# Patient Record
Sex: Male | Born: 1995 | Race: Black or African American | Hispanic: No | Marital: Single | Smoking: Current every day smoker
Health system: Southern US, Community
[De-identification: ages and names within clinical notes are randomized; demographics above are authoritative.]

## PROBLEM LIST (undated history)

## (undated) DIAGNOSIS — F988 Other specified behavioral and emotional disorders with onset usually occurring in childhood and adolescence: Secondary | ICD-10-CM

## (undated) HISTORY — PX: TONSILLECTOMY: SUR1361

## (undated) HISTORY — PX: TONSILLECTOMY AND ADENOIDECTOMY: SUR1326

---
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
## 2018-05-10 ENCOUNTER — Encounter (HOSPITAL_COMMUNITY): Payer: Self-pay

## 2018-05-10 ENCOUNTER — Emergency Department (HOSPITAL_COMMUNITY)
Admission: EM | Admit: 2018-05-10 | Discharge: 2018-05-10 | Disposition: A | Discharge status: Home / Self Care | Payer: Self-pay | Attending: Emergency Medicine | Admitting: Emergency Medicine

## 2018-05-10 ENCOUNTER — Other Ambulatory Visit: Payer: Self-pay

## 2018-05-10 DIAGNOSIS — Z79899 Other long term (current) drug therapy: Secondary | ICD-10-CM | POA: Insufficient documentation

## 2018-05-10 DIAGNOSIS — A549 Gonococcal infection, unspecified: Secondary | ICD-10-CM | POA: Insufficient documentation

## 2018-05-10 DIAGNOSIS — Z202 Contact with and (suspected) exposure to infections with a predominantly sexual mode of transmission: Secondary | ICD-10-CM | POA: Insufficient documentation

## 2018-05-10 DIAGNOSIS — R369 Urethral discharge, unspecified: Secondary | ICD-10-CM

## 2018-05-10 DIAGNOSIS — A749 Chlamydial infection, unspecified: Secondary | ICD-10-CM | POA: Insufficient documentation

## 2018-05-10 LAB — URINALYSIS, ROUTINE W REFLEX MICROSCOPIC
Bilirubin Urine: NEGATIVE
Glucose, UA: NEGATIVE mg/dL
Hgb urine dipstick: NEGATIVE
Ketones, ur: NEGATIVE mg/dL
Nitrite: NEGATIVE
Protein, ur: NEGATIVE mg/dL
Specific Gravity, Urine: 1.021 (ref 1.005–1.030)
WBC, UA: >50 WBC/hpf — ABNORMAL HIGH (ref 0–5)
pH: 7 (ref 5.0–8.0)

## 2018-05-10 MED ORDER — AZITHROMYCIN 250 MG PO TABS
1000.0000 mg | ORAL_TABLET | Freq: Once | ORAL | Status: AC
Start: 2018-05-10 — End: 2018-05-10
  Administered 2018-05-10: 1000 mg via ORAL
  Filled 2018-05-10: qty 4

## 2018-05-10 MED ORDER — CEFTRIAXONE SODIUM 250 MG IJ SOLR
250.0000 mg | Freq: Once | INTRAMUSCULAR | Status: AC
  Administered 2018-05-10: 250 mg via INTRAMUSCULAR
  Filled 2018-05-10: qty 250

## 2018-05-10 MED ORDER — LIDOCAINE HCL (PF) 1 % IJ SOLN
INTRAMUSCULAR | Status: AC
  Administered 2018-05-10: 0.9 mL
  Filled 2018-05-10: qty 5

## 2018-05-10 NOTE — ED Triage Notes (Signed)
Pt here for evaluation of green discharge from the penis.  Had sex with new partner Monday and she has Gonorrhea.  No other complaints.

## 2018-05-10 NOTE — ED Provider Notes (Signed)
[facility] EMERGENCY DEPARTMENT Provider Note   CSN: [id number] Arrival date & time: 05/10/18  1814     History   Chief Complaint Chief Complaint  Patient presents with  . Exposure to STD    HPI Blake Clark is a 22 y.o. male.  HPI   22 year old male presents today with complaints of penile discharge.  Patient notes she had sex with a new partner approximately 4 days ago.  He notes discharge from his penis today.  He notes some burning with urination.  Patient reports he has sex with only male partners.  Denies any other complaints including abdominal pain fever nausea or vomiting.  Past Medical History:  Diagnosis Date  . Attention deficit disorder (ADD)     There are no active problems to display for this patient.   Past Surgical History:  Procedure Laterality Date  . TONSILLECTOMY    . TONSILLECTOMY AND ADENOIDECTOMY          Home Medications    Prior to Admission medications   Medication Sig Start Date End Date Taking? Authorizing Provider  cephALEXin (KEFLEX) 500 MG capsule Take 1 capsule (500 mg total) by mouth 4 (four) times daily. 06/17/15   Hess, Nada Boozer, PA-C  HYDROcodone-acetaminophen (NORCO/VICODIN) 5-325 MG tablet Take 1 tablet by mouth every 6 (six) hours as needed for severe pain. 09/04/15   Ward, Chase Picket, PA-C  ibuprofen (ADVIL,MOTRIN) 800 MG tablet Take 1 tablet (800 mg total) by mouth 3 (three) times daily. 09/04/15   Ward, Chase Picket, PA-C    Family History History reviewed. No pertinent family history.  Social History Social History   Tobacco Use  . Smoking status: Never Smoker  . Smokeless tobacco: Never Used  Substance Use Topics  . Alcohol use: No  . Drug use: No     Allergies   Patient has no known allergies.   Review of Systems Review of Systems  All other systems reviewed and are negative.    Physical Exam Updated Vital Signs BP (!) 145/100   Pulse 62   Temp 98 F (36.7 C)   Resp 19    SpO2 100%   Physical Exam  Constitutional: He is oriented to person, place, and time. He appears well-developed and well-nourished.  HENT:  Head: Normocephalic and atraumatic.  Eyes: Pupils are equal, round, and reactive to light. Conjunctivae are normal. Right eye exhibits no discharge. Left eye exhibits no discharge. No scleral icterus.  Neck: Normal range of motion. No JVD present. No tracheal deviation present.  Pulmonary/Chest: Effort normal. No stridor.  Abdominal: Soft. He exhibits no distension. There is no tenderness.  Genitourinary:  Genitourinary Comments: Uncircumcised penis with purulent discharge-no rashes  Neurological: He is alert and oriented to person, place, and time. Coordination normal.  Psychiatric: He has a normal mood and affect. His behavior is normal. Judgment and thought content normal.  Nursing note and vitals reviewed.    ED Treatments / Results  Labs (all labs ordered are listed, but only abnormal results are displayed) Labs Reviewed  URINALYSIS, ROUTINE W REFLEX MICROSCOPIC - Abnormal; Notable for the following components:      Result Value   APPearance CLOUDY (*)    Leukocytes, UA SMALL (*)    WBC, UA >50 (*)    Bacteria, UA FEW (*)    All other components within normal limits  GC/CHLAMYDIA PROBE AMP ([place]) NOT AT [facility]    EKG None  Radiology No results found.  Procedures  Procedures (including critical care time)  Medications Ordered in ED Medications  cefTRIAXone (ROCEPHIN) injection 250 mg (250 mg Intramuscular Given 05/10/18 1938)  azithromycin (ZITHROMAX) tablet 1,000 mg (1,000 mg Oral Given 05/10/18 1937)  lidocaine (PF) (XYLOCAINE) 1 % injection (0.9 mLs  Given 05/10/18 1938)     Initial Impression / Assessment and Plan / ED Course  I have reviewed the triage vital signs and the nursing notes.  Pertinent labs & imaging results that were available during my care of the patient were reviewed by me and considered in my  medical decision making (see chart for details).     Patient presents with likely STD.  Treated prophylactically return precautions given.  He verbalized understanding and agreement to today's plan.  Final Clinical Impressions(s) / ED Diagnoses   Final diagnoses:  Penile discharge    ED Discharge Orders    None       Rosalio Loud 05/10/18 2132    Raeford Razor, MD 05/11/18 1136

## 2018-05-10 NOTE — Discharge Instructions (Addendum)
Please read attached information. If you experience any new or worsening signs or symptoms please return to the emergency room for evaluation. Please follow-up with your primary care provider or specialist as discussed.  °

## 2018-05-11 LAB — GC/CHLAMYDIA PROBE AMP (~~LOC~~) NOT AT ARMC
Chlamydia: POSITIVE — AB
Chlamydia: POSITIVE — AB
Neisseria Gonorrhea: POSITIVE — AB
Neisseria Gonorrhea: POSITIVE — AB

## 2018-06-16 ENCOUNTER — Encounter (HOSPITAL_COMMUNITY): Payer: Self-pay

## 2018-06-16 ENCOUNTER — Other Ambulatory Visit: Payer: Self-pay

## 2018-06-16 ENCOUNTER — Emergency Department (HOSPITAL_COMMUNITY)
Admission: EM | Admit: 2018-06-16 | Discharge: 2018-06-16 | Disposition: A | Discharge status: Home / Self Care | Payer: Self-pay | Attending: Emergency Medicine | Admitting: Emergency Medicine

## 2018-06-16 DIAGNOSIS — F1290 Cannabis use, unspecified, uncomplicated: Secondary | ICD-10-CM | POA: Insufficient documentation

## 2018-06-16 DIAGNOSIS — Z202 Contact with and (suspected) exposure to infections with a predominantly sexual mode of transmission: Secondary | ICD-10-CM | POA: Insufficient documentation

## 2018-06-16 DIAGNOSIS — R369 Urethral discharge, unspecified: Secondary | ICD-10-CM | POA: Insufficient documentation

## 2018-06-16 DIAGNOSIS — Z711 Person with feared health complaint in whom no diagnosis is made: Secondary | ICD-10-CM

## 2018-06-16 MED ORDER — AZITHROMYCIN 1 G PO PACK
1.0000 g | PACK | Freq: Once | ORAL | Status: AC
  Administered 2018-06-16: 1 g via ORAL
  Filled 2018-06-16: qty 1

## 2018-06-16 MED ORDER — CEFTRIAXONE SODIUM 250 MG IJ SOLR
250.0000 mg | Freq: Once | INTRAMUSCULAR | Status: AC
  Administered 2018-06-16: 250 mg via INTRAMUSCULAR
  Filled 2018-06-16: qty 250

## 2018-06-16 NOTE — ED Provider Notes (Addendum)
Emergency Department Provider Note   I have reviewed the triage vital signs and the nursing notes.   HISTORY  Chief Complaint Penile Discharge   HPI Blake Clark is a [age] y.o. male who was recently treated for gc/chlamydia but had sex with partner that he had when he was infected and now is back with symptoms again. She tested positive for chlamydia as well. Has dysuria, discharge. No back pain or rectal pain.   No other associated or modifying symptoms.    Past Medical History:  Diagnosis Date  . Attention deficit disorder (ADD)     There are no active problems to display for this patient.   Past Surgical History:  Procedure Laterality Date  . TONSILLECTOMY    . TONSILLECTOMY AND ADENOIDECTOMY        Allergies Patient has no known allergies.  History reviewed. No pertinent family history.  Social History Social History   Tobacco Use  . Smoking status: Never Smoker  . Smokeless tobacco: Never Used  Substance Use Topics  . Alcohol use: No  . Drug use: Yes    Types: Marijuana    Comment: DAILY    Review of Systems  All other systems negative except as documented in the HPI. All pertinent positives and negatives as reviewed in the HPI. ____________________________________________   PHYSICAL EXAM:  VITAL SIGNS: ED Triage Vitals  Enc Vitals Group     BP 06/16/18 0213 133/79     Pulse Rate 06/16/18 0213 74     Resp 06/16/18 0213 18     Temp 06/16/18 0220 (!) 97.3 F (36.3 C)     Temp Source 06/16/18 0220 Oral     SpO2 06/16/18 0213 98 %     Weight 06/16/18 0213 225 lb (102.1 kg)     Height 06/16/18 0213 6\' 2"  (1.88 m)    Constitutional: Alert and oriented. Well appearing and in no acute distress. Eyes: Conjunctivae are normal. PERRL. EOMI. Head: Atraumatic. Nose: No congestion/rhinnorhea. Mouth/Throat: Mucous membranes are moist.  Oropharynx non-erythematous. Neck: No stridor.  No meningeal signs.   Cardiovascular: Normal rate, regular  rhythm. Good peripheral circulation. Grossly normal heart sounds.   Respiratory: Normal respiratory effort.  No retractions. Lungs CTAB. Gastrointestinal: Soft and nontender. No distention.  GU: no rash, urethritis. Did have discharge.  Musculoskeletal: No lower extremity tenderness nor edema. No gross deformities of extremities. Neurologic:  Normal speech and language. No gross focal neurologic deficits are appreciated.  Skin:  Skin is warm, dry and intact. No rash noted.  ____________________________________________   LABS (all labs ordered are listed, but only abnormal results are displayed)  Labs Reviewed  GC/CHLAMYDIA PROBE AMP ([place]) NOT [facility]   ____________________________________________   INITIAL IMPRESSION / ASSESSMENT AND PLAN / ED COURSE  tx for gc/chlam. Recheck. Advised no intercourse for 2 weeks until both were cleared by respective PCP or health departments.      Pertinent labs & imaging results that were available during my care of the patient were reviewed by me and considered in my medical decision making (see chart for details).  ____________________________________________  FINAL CLINICAL IMPRESSION(S) / ED DIAGNOSES  Final diagnoses:  Penile discharge  Concern about STD in male without diagnosis     MEDICATIONS GIVEN DURING THIS VISIT:  Medications  cefTRIAXone (ROCEPHIN) injection 250 mg (250 mg Intramuscular Given 06/16/18 0312)  azithromycin (ZITHROMAX) powder 1 g (1 g Oral Given 06/16/18 0311)     NEW OUTPATIENT MEDICATIONS STARTED DURING THIS  VISIT:  There are no discharge medications for this patient.   Note:  This note was prepared with assistance of Dragon voice recognition software. Occasional wrong-word or sound-a-like substitutions may have occurred due to the inherent limitations of voice recognition software.   Marily Memos, MD 06/16/18 1610    Marily Memos, MD 06/29/18 [phone]

## 2018-06-16 NOTE — ED Triage Notes (Addendum)
PT C/O PENILE DISCHARGE AND IRRITATION SINCE YESTERDAY. DENIES FEVER. PT STS HIS PARTNER WAS POSITIVE FOR GONORRHEA.

## 2018-06-18 LAB — GC/CHLAMYDIA PROBE AMP (~~LOC~~) NOT AT ARMC
Chlamydia: NEGATIVE
Neisseria Gonorrhea: POSITIVE — AB

## 2019-05-03 ENCOUNTER — Emergency Department (HOSPITAL_COMMUNITY)
Admission: EM | Admit: 2019-05-03 | Discharge: 2019-05-03 | Discharge status: Against Medical Advice | Payer: No Typology Code available for payment source

## 2019-05-03 ENCOUNTER — Other Ambulatory Visit: Payer: Self-pay

## 2019-05-03 NOTE — ED Notes (Signed)
Called pt x3 for triage. No answer.

## 2020-04-29 ENCOUNTER — Encounter (HOSPITAL_COMMUNITY): Payer: Self-pay | Admitting: *Deleted

## 2020-04-29 ENCOUNTER — Emergency Department (HOSPITAL_COMMUNITY)
Admission: EM | Admit: 2020-04-29 | Discharge: 2020-04-29 | Disposition: A | Discharge status: Home / Self Care | Payer: Medicaid Other | Attending: Physician Assistant | Admitting: Physician Assistant

## 2020-04-29 ENCOUNTER — Other Ambulatory Visit: Payer: Self-pay

## 2020-04-29 DIAGNOSIS — R369 Urethral discharge, unspecified: Secondary | ICD-10-CM | POA: Insufficient documentation

## 2020-04-29 DIAGNOSIS — Z113 Encounter for screening for infections with a predominantly sexual mode of transmission: Secondary | ICD-10-CM | POA: Insufficient documentation

## 2020-04-29 MED ORDER — DOXYCYCLINE HYCLATE 100 MG PO TABS
100.0000 mg | ORAL_TABLET | Freq: Once | ORAL | Status: AC
  Administered 2020-04-29: 100 mg via ORAL
  Filled 2020-04-29: qty 1

## 2020-04-29 MED ORDER — CEFTRIAXONE SODIUM 1 G IJ SOLR
500.0000 mg | Freq: Once | INTRAMUSCULAR | Status: AC
  Administered 2020-04-29: 500 mg via INTRAMUSCULAR
  Filled 2020-04-29: qty 10

## 2020-04-29 MED ORDER — LIDOCAINE HCL 1 % IJ SOLN
INTRAMUSCULAR | Status: AC
  Filled 2020-04-29: qty 20

## 2020-04-29 MED ORDER — DOXYCYCLINE HYCLATE 100 MG PO CAPS: 100.0000 mg | ORAL_CAPSULE | Freq: Two times a day (BID) | ORAL | 0 refills | Status: AC

## 2020-04-29 NOTE — ED Triage Notes (Signed)
Unprotected sex 2 days ago, today clear discharge from penis

## 2020-04-29 NOTE — Discharge Instructions (Addendum)
You will be called if your STD results are positive.  Take the antibiotics as prescribed  Return for new or worsening symptoms

## 2020-04-29 NOTE — ED Provider Notes (Signed)
[facility]-EMERGENCY DEPT Provider Note   CSN: [id number] Arrival date & time: 04/29/20  1422     History Chief Complaint  Patient presents with  . SEXUALLY TRANSMITTED DISEASE    Blake Clark is a 24 y.o. male with recent past medical history who presents for evaluation of penile discharge.  Patient states over the last day he has noted clear discharge to his penis.  Does admit to unprotected sex approximately 3 to 4 days ago.  Intermittently uses protection.  Department history and gonorrhea chlamydia.  Denies fever, chills, nausea, vomiting, abdominal pain, pain with bowel movements, rashes, lesions, purulent penile discharge, testicular pain, redness, swelling or warmth.  Denies any pain.  Denies aggravating or relieving factors.  History obtained from patient and past medical records.  No interpreter used.  HPI     Past Medical History:  Diagnosis Date  . Attention deficit disorder (ADD)     There are no problems to display for this patient.   Past Surgical History:  Procedure Laterality Date  . TONSILLECTOMY    . TONSILLECTOMY AND ADENOIDECTOMY         No family history on file.  Social History   Tobacco Use  . Smoking status: Never Smoker  . Smokeless tobacco: Never Used  Vaping Use  . Vaping Use: Never used  Substance Use Topics  . Alcohol use: Yes  . Drug use: Yes    Types: Marijuana    Comment: DAILY    Home Medications Prior to Admission medications   Medication Sig Start Date End Date Taking? Authorizing Provider  doxycycline (VIBRAMYCIN) 100 MG capsule Take 1 capsule (100 mg total) by mouth 2 (two) times daily for 7 days. 04/29/20 05/06/20  Tayveon Lombardo A, PA-C    Allergies    Patient has no known allergies.  Review of Systems   Review of Systems  Constitutional: Negative.   HENT: Negative.   Respiratory: Negative.   Cardiovascular: Negative.   Gastrointestinal: Negative.   Genitourinary: Positive for  discharge. Negative for decreased urine volume, difficulty urinating, dysuria, flank pain, frequency, genital sores, hematuria, penile pain, penile swelling, scrotal swelling, testicular pain and urgency.  Musculoskeletal: Negative.   Skin: Negative.   Neurological: Negative.   All other systems reviewed and are negative.   Physical Exam Updated Vital Signs BP (!) 150/118 (BP Location: Right Arm)   Pulse 88   Temp 98.2 F (36.8 C) (Oral)   Resp 16   Ht 6\' 2"  (1.88 m)   Wt 102.1 kg   SpO2 96%   BMI 28.89 kg/m   Physical Exam Vitals and nursing note reviewed.  Constitutional:      General: He is not in acute distress.    Appearance: He is well-developed. He is not ill-appearing, toxic-appearing or diaphoretic.  HENT:     Head: Normocephalic and atraumatic.  Eyes:     Pupils: Pupils are equal, round, and reactive to light.  Cardiovascular:     Rate and Rhythm: Normal rate and regular rhythm.     Pulses: Normal pulses.     Heart sounds: Normal heart sounds.  Pulmonary:     Effort: Pulmonary effort is normal. No respiratory distress.     Breath sounds: Normal breath sounds.  Abdominal:     General: Bowel sounds are normal. There is no distension.     Palpations: Abdomen is soft.     Tenderness: There is no abdominal tenderness. There is no guarding or rebound.  Genitourinary:  Comments: Declined Musculoskeletal:        General: Normal range of motion.     Cervical back: Normal range of motion and neck supple.  Skin:    General: Skin is warm and dry.     Capillary Refill: Capillary refill takes less than 2 seconds.  Neurological:     General: No focal deficit present.     Mental Status: He is alert and oriented to person, place, and time.     ED Results / Procedures / Treatments   Labs (all labs ordered are listed, but only abnormal results are displayed) Labs Reviewed  GC/CHLAMYDIA PROBE AMP ([place]) NOT AT [facility]    EKG None  Radiology No results  found.  Procedures Procedures (including critical care time)  Medications Ordered in ED Medications  cefTRIAXone (ROCEPHIN) injection 500 mg (has no administration in time range)  doxycycline (VIBRA-TABS) tablet 100 mg (has no administration in time range)    ED Course  I have reviewed the triage vital signs and the nursing notes.  Pertinent labs & imaging results that were available during my care of the patient were reviewed by me and considered in my medical decision making (see chart for details).  Patient presents for evaluation of requesting STD screen after unprotected sex 3 days ago and now has developed clear penile discharge.  History of gonorrhea and chlamydia.  He does not want HIV or syphilis testing at this time.  Patient is afebrile without abdominal tenderness, abdominal pain or painful bowel movements to indicate prostatitis.  Declined GU exam however denies pain to testes or epididymis to suggest orchitis or epididymitis.  STD cultures obtained including gonorrhea and chlamydia. Patient to be discharged with instructions to follow up with PCP. Discussed importance of using protection when sexually active. Pt understands that they have GC/Chlamydia cultures pending and that they will need to inform all sexual partners if results return positive. Patient has been treated prophylactically with Doxy and Rocephin.   The patient has been appropriately medically screened and/or stabilized in the ED. I have low suspicion for any other emergent medical condition which would require further screening, evaluation or treatment in the ED or require inpatient management.  Patient is hemodynamically stable and in no acute distress.  Patient able to ambulate in department prior to ED.  Evaluation does not show acute pathology that would require ongoing or additional emergent interventions while in the emergency department or further inpatient treatment.  I have discussed the diagnosis with  the patient and answered all questions.  Pain is been managed while in the emergency department and patient has no further complaints prior to discharge.  Patient is comfortable with plan discussed in room and is stable for discharge at this time.  I have discussed strict return precautions for returning to the emergency department.  Patient was encouraged to follow-up with PCP/specialist refer to at discharge.      MDM Rules/Calculators/A&P                          Final Clinical Impression(s) / ED Diagnoses Final diagnoses:  Penile discharge  Encounter for screening examination for sexually transmitted disease    Rx / DC Orders ED Discharge Orders         Ordered    doxycycline (VIBRAMYCIN) 100 MG capsule  2 times daily        04/29/20 1538           Avilyn Virtue,  Aariona Momon A, PA-C 04/29/20 1539    Alvira Monday, MD 05/01/20 [phone]

## 2020-04-30 LAB — GC/CHLAMYDIA PROBE AMP (~~LOC~~) NOT AT ARMC
Chlamydia: NEGATIVE
Comment: NEGATIVE
Comment: NORMAL
Neisseria Gonorrhea: POSITIVE — AB

## 2020-05-27 ENCOUNTER — Emergency Department (HOSPITAL_COMMUNITY)
Admission: EM | Admit: 2020-05-27 | Discharge: 2020-05-27 | Disposition: A | Discharge status: Home / Self Care | Payer: Medicaid Other | Attending: Emergency Medicine | Admitting: Emergency Medicine

## 2020-05-27 ENCOUNTER — Other Ambulatory Visit: Payer: Self-pay

## 2020-05-27 ENCOUNTER — Encounter (HOSPITAL_COMMUNITY): Payer: Self-pay

## 2020-05-27 DIAGNOSIS — R369 Urethral discharge, unspecified: Secondary | ICD-10-CM | POA: Insufficient documentation

## 2020-05-27 MED ORDER — DOXYCYCLINE HYCLATE 100 MG PO TABS
100.0000 mg | ORAL_TABLET | Freq: Once | ORAL | Status: AC
  Administered 2020-05-27: 100 mg via ORAL
  Filled 2020-05-27: qty 1

## 2020-05-27 MED ORDER — DOXYCYCLINE HYCLATE 100 MG PO CAPS: 100.0000 mg | ORAL_CAPSULE | Freq: Two times a day (BID) | ORAL | 0 refills | Status: AC

## 2020-05-27 MED ORDER — CEFTRIAXONE SODIUM 1 G IJ SOLR
500.0000 mg | Freq: Once | INTRAMUSCULAR | Status: AC
  Administered 2020-05-27: 500 mg via INTRAMUSCULAR
  Filled 2020-05-27: qty 10

## 2020-05-27 MED ORDER — STERILE WATER FOR INJECTION IJ SOLN
INTRAMUSCULAR | Status: AC
  Administered 2020-05-27: 10 mL
  Filled 2020-05-27: qty 10

## 2020-05-27 MED ORDER — AZITHROMYCIN 1 G PO PACK
2.0000 g | PACK | Freq: Once | ORAL | Status: AC
  Administered 2020-05-27: 2 g via ORAL
  Filled 2020-05-27: qty 2

## 2020-05-27 NOTE — ED Triage Notes (Signed)
Patient arrived stating starting yesterday he began having clear/white penile discharge.
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
## 2020-05-27 NOTE — ED Notes (Signed)
Pt refused lab work. Patient stated " they have never had to draw blood work before so I am not sure why I have to have it now". RN notified.

## 2020-07-28 ENCOUNTER — Other Ambulatory Visit: Payer: Self-pay

## 2020-07-28 ENCOUNTER — Encounter (HOSPITAL_COMMUNITY): Payer: Self-pay

## 2020-07-28 ENCOUNTER — Emergency Department (HOSPITAL_COMMUNITY)
Admission: EM | Admit: 2020-07-28 | Discharge: 2020-07-28 | Disposition: A | Discharge status: Home / Self Care | Payer: Medicaid Other | Attending: Emergency Medicine | Admitting: Emergency Medicine

## 2020-07-28 DIAGNOSIS — R059 Cough, unspecified: Secondary | ICD-10-CM | POA: Insufficient documentation

## 2020-07-28 DIAGNOSIS — Z5321 Procedure and treatment not carried out due to patient leaving prior to being seen by health care provider: Secondary | ICD-10-CM | POA: Insufficient documentation

## 2020-07-28 DIAGNOSIS — R109 Unspecified abdominal pain: Secondary | ICD-10-CM | POA: Insufficient documentation

## 2020-07-28 DIAGNOSIS — R509 Fever, unspecified: Secondary | ICD-10-CM | POA: Insufficient documentation

## 2020-07-28 DIAGNOSIS — M791 Myalgia, unspecified site: Secondary | ICD-10-CM | POA: Insufficient documentation

## 2020-07-28 MED ORDER — ACETAMINOPHEN 325 MG PO TABS
650.0000 mg | ORAL_TABLET | Freq: Once | ORAL | Status: AC | PRN
  Administered 2020-07-28: 650 mg via ORAL
  Filled 2020-07-28: qty 2

## 2020-07-28 NOTE — ED Notes (Signed)
Called 3x for room placement. Eloped from waiting area.  

## 2020-07-28 NOTE — ED Triage Notes (Signed)
Patient c/o fever, body aches, and abdominal pain since yesterday and a cough today.

## 2021-05-25 ENCOUNTER — Inpatient Hospital Stay (HOSPITAL_COMMUNITY)
Admission: EM | Admit: 2021-05-25 | Discharge: 2021-05-27 | DRG: 481 | Disposition: A | Discharge status: Home / Self Care | Payer: Self-pay | Attending: General Surgery | Admitting: General Surgery

## 2021-05-25 ENCOUNTER — Emergency Department (HOSPITAL_COMMUNITY): Payer: Medicaid Other

## 2021-05-25 DIAGNOSIS — Z20822 Contact with and (suspected) exposure to covid-19: Secondary | ICD-10-CM | POA: Diagnosis present

## 2021-05-25 DIAGNOSIS — Z419 Encounter for procedure for purposes other than remedying health state, unspecified: Secondary | ICD-10-CM

## 2021-05-25 DIAGNOSIS — D62 Acute posthemorrhagic anemia: Secondary | ICD-10-CM | POA: Diagnosis present

## 2021-05-25 DIAGNOSIS — S7290XA Unspecified fracture of unspecified femur, initial encounter for closed fracture: Secondary | ICD-10-CM

## 2021-05-25 DIAGNOSIS — S7291XA Unspecified fracture of right femur, initial encounter for closed fracture: Secondary | ICD-10-CM | POA: Diagnosis present

## 2021-05-25 DIAGNOSIS — S72351B Displaced comminuted fracture of shaft of right femur, initial encounter for open fracture type I or II: Principal | ICD-10-CM | POA: Diagnosis present

## 2021-05-25 DIAGNOSIS — W3400XA Accidental discharge from unspecified firearms or gun, initial encounter: Secondary | ICD-10-CM

## 2021-05-25 MED ORDER — SODIUM CHLORIDE 0.9 % IV BOLUS
1000.0000 mL | Freq: Once | INTRAVENOUS | Status: AC
  Administered 2021-05-25: 1000 mL via INTRAVENOUS

## 2021-05-25 MED ORDER — CEFAZOLIN SODIUM-DEXTROSE 2-4 GM/100ML-% IV SOLN
2.0000 g | Freq: Once | INTRAVENOUS | Status: AC
  Administered 2021-05-26: 2 g via INTRAVENOUS
  Filled 2021-05-25: qty 100

## 2021-05-25 MED ORDER — SODIUM CHLORIDE 0.9 % IV SOLN: INTRAVENOUS | Status: DC
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
## 2021-05-26 ENCOUNTER — Encounter (HOSPITAL_COMMUNITY): Payer: Self-pay

## 2021-05-26 ENCOUNTER — Encounter (HOSPITAL_COMMUNITY): Admission: EM | Disposition: A | Discharge status: Home / Self Care | Payer: Self-pay | Source: Home / Self Care

## 2021-05-26 ENCOUNTER — Inpatient Hospital Stay (HOSPITAL_COMMUNITY): Payer: Medicaid Other

## 2021-05-26 ENCOUNTER — Inpatient Hospital Stay (HOSPITAL_COMMUNITY): Payer: Medicaid Other | Admitting: Certified Registered"

## 2021-05-26 ENCOUNTER — Other Ambulatory Visit: Payer: Self-pay

## 2021-05-26 DIAGNOSIS — W3400XA Accidental discharge from unspecified firearms or gun, initial encounter: Secondary | ICD-10-CM

## 2021-05-26 DIAGNOSIS — S7291XA Unspecified fracture of right femur, initial encounter for closed fracture: Secondary | ICD-10-CM | POA: Diagnosis present

## 2021-05-26 DIAGNOSIS — Y249XXA Unspecified firearm discharge, undetermined intent, initial encounter: Secondary | ICD-10-CM

## 2021-05-26 HISTORY — PX: FEMUR IM NAIL: SHX1597

## 2021-05-26 LAB — COMPREHENSIVE METABOLIC PANEL
ALT: 20 U/L (ref 0–44)
AST: 28 U/L (ref 15–41)
Albumin: 4.4 g/dL (ref 3.5–5.0)
Alkaline Phosphatase: 82 U/L (ref 38–126)
Anion gap: 14 (ref 5–15)
BUN: 13 mg/dL (ref 6–20)
CO2: 22 mmol/L (ref 22–32)
Calcium: 9.2 mg/dL (ref 8.9–10.3)
Chloride: 103 mmol/L (ref 98–111)
Creatinine, Ser: 1.52 mg/dL — ABNORMAL HIGH (ref 0.61–1.24)
GFR, Estimated: >60 mL/min (ref >60)
Glucose, Bld: 109 mg/dL — ABNORMAL HIGH (ref 70–99)
Potassium: 3.1 mmol/L — ABNORMAL LOW (ref 3.5–5.1)
Sodium: 139 mmol/L (ref 135–145)
Total Bilirubin: 1.1 mg/dL (ref 0.3–1.2)
Total Protein: 7.1 g/dL (ref 6.5–8.1)

## 2021-05-26 LAB — I-STAT CHEM 8, ED
BUN: 15 mg/dL (ref 6–20)
Calcium, Ion: 1.13 mmol/L — ABNORMAL LOW (ref 1.15–1.40)
Chloride: 105 mmol/L (ref 98–111)
Creatinine, Ser: 1.4 mg/dL — ABNORMAL HIGH (ref 0.61–1.24)
Glucose, Bld: 104 mg/dL — ABNORMAL HIGH (ref 70–99)
HCT: 47 % (ref 39.0–52.0)
Hemoglobin: 16 g/dL (ref 13.0–17.0)
Potassium: 3 mmol/L — ABNORMAL LOW (ref 3.5–5.1)
Sodium: 143 mmol/L (ref 135–145)
TCO2: 23 mmol/L (ref 22–32)

## 2021-05-26 LAB — SURGICAL PCR SCREEN
MRSA, PCR: NEGATIVE
Staphylococcus aureus: NEGATIVE

## 2021-05-26 LAB — CBC
HCT: 32.6 % — ABNORMAL LOW (ref 39.0–52.0)
HCT: 35.8 % — ABNORMAL LOW (ref 39.0–52.0)
HCT: 46 % (ref 39.0–52.0)
Hemoglobin: 10.9 g/dL — ABNORMAL LOW (ref 13.0–17.0)
Hemoglobin: 12 g/dL — ABNORMAL LOW (ref 13.0–17.0)
Hemoglobin: 15.4 g/dL (ref 13.0–17.0)
MCH: 30.7 pg (ref 26.0–34.0)
MCH: 30.8 pg (ref 26.0–34.0)
MCH: 31.2 pg (ref 26.0–34.0)
MCHC: 33.4 g/dL (ref 30.0–36.0)
MCHC: 33.5 g/dL (ref 30.0–36.0)
MCHC: 33.5 g/dL (ref 30.0–36.0)
MCV: 91.8 fL (ref 80.0–100.0)
MCV: 92 fL (ref 80.0–100.0)
MCV: 93.3 fL (ref 80.0–100.0)
Platelets: 177 10*3/uL (ref 150–400)
Platelets: 240 10*3/uL (ref 150–400)
Platelets: 267 10*3/uL (ref 150–400)
RBC: 3.55 MIL/uL — ABNORMAL LOW (ref 4.22–5.81)
RBC: 3.89 MIL/uL — ABNORMAL LOW (ref 4.22–5.81)
RBC: 4.93 MIL/uL (ref 4.22–5.81)
RDW: 13.6 % (ref 11.5–15.5)
RDW: 13.6 % (ref 11.5–15.5)
RDW: 13.7 % (ref 11.5–15.5)
WBC: 10 10*3/uL (ref 4.0–10.5)
WBC: 12.2 10*3/uL — ABNORMAL HIGH (ref 4.0–10.5)
WBC: 16.6 10*3/uL — ABNORMAL HIGH (ref 4.0–10.5)
nRBC: 0 % (ref 0.0–0.2)
nRBC: 0 % (ref 0.0–0.2)
nRBC: 0 % (ref 0.0–0.2)

## 2021-05-26 LAB — URINALYSIS, ROUTINE W REFLEX MICROSCOPIC
Bacteria, UA: NONE SEEN
Bilirubin Urine: NEGATIVE
Glucose, UA: NEGATIVE mg/dL
Hgb urine dipstick: NEGATIVE
Ketones, ur: NEGATIVE mg/dL
Leukocytes,Ua: NEGATIVE
Nitrite: NEGATIVE
Protein, ur: 100 mg/dL — AB
Specific Gravity, Urine: 1.032 — ABNORMAL HIGH (ref 1.005–1.030)
pH: 5 (ref 5.0–8.0)

## 2021-05-26 LAB — CREATININE, SERUM
Creatinine, Ser: 1.15 mg/dL (ref 0.61–1.24)
GFR, Estimated: >60 mL/min (ref >60)

## 2021-05-26 LAB — RESP PANEL BY RT-PCR (FLU A&B, COVID) ARPGX2
Influenza A by PCR: NEGATIVE
Influenza B by PCR: NEGATIVE
SARS Coronavirus 2 by RT PCR: NEGATIVE

## 2021-05-26 LAB — BASIC METABOLIC PANEL
Anion gap: 6 (ref 5–15)
BUN: 12 mg/dL (ref 6–20)
CO2: 24 mmol/L (ref 22–32)
Calcium: 8.3 mg/dL — ABNORMAL LOW (ref 8.9–10.3)
Chloride: 105 mmol/L (ref 98–111)
Creatinine, Ser: 1 mg/dL (ref 0.61–1.24)
GFR, Estimated: >60 mL/min (ref >60)
Glucose, Bld: 179 mg/dL — ABNORMAL HIGH (ref 70–99)
Potassium: 4 mmol/L (ref 3.5–5.1)
Sodium: 135 mmol/L (ref 135–145)

## 2021-05-26 LAB — PROTIME-INR
INR: 1.4 — ABNORMAL HIGH (ref 0.8–1.2)
Prothrombin Time: 17.5 seconds — ABNORMAL HIGH (ref 11.4–15.2)

## 2021-05-26 LAB — SAMPLE TO BLOOD BANK

## 2021-05-26 LAB — ETHANOL: Alcohol, Ethyl (B): 29 mg/dL — ABNORMAL HIGH (ref <10)

## 2021-05-26 LAB — HIV ANTIBODY (ROUTINE TESTING W REFLEX): HIV Screen 4th Generation wRfx: NONREACTIVE

## 2021-05-26 LAB — LACTIC ACID, PLASMA: Lactic Acid, Venous: 6.1 mmol/L (ref 0.5–1.9)

## 2021-05-26 LAB — VITAMIN D 25 HYDROXY (VIT D DEFICIENCY, FRACTURES): Vit D, 25-Hydroxy: 6.87 ng/mL — ABNORMAL LOW (ref 30–100)

## 2021-05-26 SURGERY — INSERTION, INTRAMEDULLARY ROD, FEMUR, RETROGRADE
Anesthesia: General | Laterality: Right

## 2021-05-26 MED ORDER — ACETAMINOPHEN 325 MG PO TABS: 650.0000 mg | ORAL_TABLET | ORAL | Status: DC | PRN

## 2021-05-26 MED ORDER — 0.9 % SODIUM CHLORIDE (POUR BTL) OPTIME
TOPICAL | Status: DC | PRN
  Administered 2021-05-26: 1000 mL

## 2021-05-26 MED ORDER — HYDROMORPHONE HCL 1 MG/ML IJ SOLN
0.5000 mg | INTRAMUSCULAR | Status: DC | PRN
  Administered 2021-05-26: 1 mg via INTRAVENOUS
  Filled 2021-05-26: qty 1

## 2021-05-26 MED ORDER — ENOXAPARIN SODIUM 30 MG/0.3ML IJ SOSY: 30.0000 mg | PREFILLED_SYRINGE | Freq: Two times a day (BID) | INTRAMUSCULAR | Status: DC

## 2021-05-26 MED ORDER — ACETAMINOPHEN 10 MG/ML IV SOLN
INTRAVENOUS | Status: AC
  Filled 2021-05-26: qty 100

## 2021-05-26 MED ORDER — KETOROLAC TROMETHAMINE 15 MG/ML IJ SOLN
15.0000 mg | Freq: Four times a day (QID) | INTRAMUSCULAR | Status: AC
  Administered 2021-05-26 – 2021-05-27 (×4): 15 mg via INTRAVENOUS
  Filled 2021-05-26 (×4): qty 1

## 2021-05-26 MED ORDER — LACTATED RINGERS IV SOLN: INTRAVENOUS | Status: DC

## 2021-05-26 MED ORDER — DEXAMETHASONE SODIUM PHOSPHATE 10 MG/ML IJ SOLN
INTRAMUSCULAR | Status: DC | PRN
  Administered 2021-05-26: 10 mg via INTRAVENOUS

## 2021-05-26 MED ORDER — MEPERIDINE HCL 25 MG/ML IJ SOLN: 6.2500 mg | INTRAMUSCULAR | Status: DC | PRN

## 2021-05-26 MED ORDER — FENTANYL CITRATE (PF) 250 MCG/5ML IJ SOLN
INTRAMUSCULAR | Status: DC | PRN
  Administered 2021-05-26 (×5): 50 ug via INTRAVENOUS

## 2021-05-26 MED ORDER — DIPHENHYDRAMINE HCL 12.5 MG/5ML PO ELIX
12.5000 mg | ORAL_SOLUTION | ORAL | Status: DC | PRN
  Administered 2021-05-26: 25 mg via ORAL
  Filled 2021-05-26: qty 10

## 2021-05-26 MED ORDER — ONDANSETRON HCL 4 MG/2ML IJ SOLN
4.0000 mg | Freq: Once | INTRAMUSCULAR | Status: AC
  Administered 2021-05-26: 4 mg via INTRAVENOUS
  Filled 2021-05-26: qty 2

## 2021-05-26 MED ORDER — KETOROLAC TROMETHAMINE 30 MG/ML IJ SOLN: 30.0000 mg | Freq: Once | INTRAMUSCULAR | Status: DC | PRN

## 2021-05-26 MED ORDER — POLYETHYLENE GLYCOL 3350 17 G PO PACK: 17.0000 g | PACK | Freq: Every day | ORAL | Status: DC | PRN

## 2021-05-26 MED ORDER — METHOCARBAMOL 500 MG PO TABS: 500.0000 mg | ORAL_TABLET | Freq: Four times a day (QID) | ORAL | Status: DC | PRN

## 2021-05-26 MED ORDER — MORPHINE SULFATE (PF) 2 MG/ML IV SOLN
2.0000 mg | INTRAVENOUS | Status: DC | PRN
  Administered 2021-05-26 (×4): 4 mg via INTRAVENOUS
  Filled 2021-05-26 (×5): qty 2

## 2021-05-26 MED ORDER — CHLORHEXIDINE GLUCONATE 0.12 % MT SOLN
OROMUCOSAL | Status: AC
  Filled 2021-05-26: qty 15

## 2021-05-26 MED ORDER — OXYCODONE HCL 5 MG/5ML PO SOLN
5.0000 mg | Freq: Once | ORAL | Status: DC | PRN
Start: 2021-05-26 — End: 2021-05-26

## 2021-05-26 MED ORDER — LIDOCAINE 2% (20 MG/ML) 5 ML SYRINGE
INTRAMUSCULAR | Status: DC | PRN
  Administered 2021-05-26: 100 mg via INTRAVENOUS

## 2021-05-26 MED ORDER — PROPOFOL 10 MG/ML IV BOLUS
INTRAVENOUS | Status: DC | PRN
  Administered 2021-05-26: 200 mg via INTRAVENOUS

## 2021-05-26 MED ORDER — FENTANYL CITRATE (PF) 250 MCG/5ML IJ SOLN
INTRAMUSCULAR | Status: AC
  Filled 2021-05-26: qty 5

## 2021-05-26 MED ORDER — POVIDONE-IODINE 10 % EX SWAB: 2.0000 "application " | Freq: Once | CUTANEOUS | Status: DC

## 2021-05-26 MED ORDER — CHLORHEXIDINE GLUCONATE 4 % EX LIQD: 60.0000 mL | Freq: Once | CUTANEOUS | Status: DC

## 2021-05-26 MED ORDER — METOCLOPRAMIDE HCL 5 MG PO TABS
5.0000 mg | ORAL_TABLET | Freq: Three times a day (TID) | ORAL | Status: DC | PRN
Start: 2021-05-26 — End: 2021-05-27

## 2021-05-26 MED ORDER — IOHEXOL 350 MG/ML SOLN
100.0000 mL | Freq: Once | INTRAVENOUS | Status: AC | PRN
  Administered 2021-05-26: 100 mL via INTRAVENOUS

## 2021-05-26 MED ORDER — MIDAZOLAM HCL 5 MG/5ML IJ SOLN
INTRAMUSCULAR | Status: DC | PRN
  Administered 2021-05-26: 2 mg via INTRAVENOUS

## 2021-05-26 MED ORDER — OXYCODONE HCL 5 MG PO TABS: 5.0000 mg | ORAL_TABLET | ORAL | Status: DC | PRN

## 2021-05-26 MED ORDER — CHLORHEXIDINE GLUCONATE 0.12 % MT SOLN
15.0000 mL | Freq: Once | OROMUCOSAL | Status: AC
  Administered 2021-05-26: 15 mL via OROMUCOSAL

## 2021-05-26 MED ORDER — ACETAMINOPHEN 10 MG/ML IV SOLN
INTRAVENOUS | Status: DC | PRN
  Administered 2021-05-26: 1000 mg via INTRAVENOUS

## 2021-05-26 MED ORDER — VANCOMYCIN HCL 1000 MG IV SOLR
INTRAVENOUS | Status: AC
  Filled 2021-05-26: qty 20

## 2021-05-26 MED ORDER — DOCUSATE SODIUM 100 MG PO CAPS: 100.0000 mg | ORAL_CAPSULE | Freq: Two times a day (BID) | ORAL | Status: DC

## 2021-05-26 MED ORDER — OXYCODONE HCL 5 MG PO TABS: 10.0000 mg | ORAL_TABLET | ORAL | Status: DC | PRN

## 2021-05-26 MED ORDER — OXYCODONE HCL 5 MG PO TABS: 5.0000 mg | ORAL_TABLET | Freq: Once | ORAL | Status: DC | PRN

## 2021-05-26 MED ORDER — DEXMEDETOMIDINE (PRECEDEX) IN NS 20 MCG/5ML (4 MCG/ML) IV SYRINGE
PREFILLED_SYRINGE | INTRAVENOUS | Status: DC | PRN
  Administered 2021-05-26: 12 ug via INTRAVENOUS
  Administered 2021-05-26 (×2): 4 ug via INTRAVENOUS

## 2021-05-26 MED ORDER — METOCLOPRAMIDE HCL 5 MG/ML IJ SOLN: 5.0000 mg | Freq: Three times a day (TID) | INTRAMUSCULAR | Status: DC | PRN

## 2021-05-26 MED ORDER — LACTATED RINGERS IV SOLN: INTRAVENOUS | Status: DC | PRN

## 2021-05-26 MED ORDER — ACETAMINOPHEN 500 MG PO TABS
1000.0000 mg | ORAL_TABLET | Freq: Four times a day (QID) | ORAL | Status: DC
  Administered 2021-05-26 – 2021-05-27 (×3): 1000 mg via ORAL
  Filled 2021-05-26 (×3): qty 2

## 2021-05-26 MED ORDER — ONDANSETRON HCL 4 MG/2ML IJ SOLN
INTRAMUSCULAR | Status: DC | PRN
  Administered 2021-05-26: 4 mg via INTRAVENOUS

## 2021-05-26 MED ORDER — ONDANSETRON HCL 4 MG/2ML IJ SOLN: 4.0000 mg | Freq: Four times a day (QID) | INTRAMUSCULAR | Status: DC | PRN

## 2021-05-26 MED ORDER — ONDANSETRON 4 MG PO TBDP: 4.0000 mg | ORAL_TABLET | Freq: Four times a day (QID) | ORAL | Status: DC | PRN

## 2021-05-26 MED ORDER — HYDROMORPHONE HCL 1 MG/ML IJ SOLN: 0.2500 mg | INTRAMUSCULAR | Status: DC | PRN

## 2021-05-26 MED ORDER — DOCUSATE SODIUM 100 MG PO CAPS
100.0000 mg | ORAL_CAPSULE | Freq: Two times a day (BID) | ORAL | Status: DC
  Administered 2021-05-27: 100 mg via ORAL
  Filled 2021-05-26: qty 1

## 2021-05-26 MED ORDER — ONDANSETRON HCL 4 MG PO TABS: 4.0000 mg | ORAL_TABLET | Freq: Four times a day (QID) | ORAL | Status: DC | PRN

## 2021-05-26 MED ORDER — DEXTROSE-NACL 5-0.45 % IV SOLN: INTRAVENOUS | Status: DC

## 2021-05-26 MED ORDER — CEFAZOLIN SODIUM-DEXTROSE 2-4 GM/100ML-% IV SOLN
2.0000 g | Freq: Three times a day (TID) | INTRAVENOUS | Status: DC
Start: 2021-05-27 — End: 2021-05-27
  Administered 2021-05-26 – 2021-05-27 (×2): 2 g via INTRAVENOUS
  Filled 2021-05-26 (×2): qty 100

## 2021-05-26 MED ORDER — MIDAZOLAM HCL 2 MG/2ML IJ SOLN
INTRAMUSCULAR | Status: AC
  Filled 2021-05-26: qty 2

## 2021-05-26 MED ORDER — PROPOFOL 10 MG/ML IV BOLUS
INTRAVENOUS | Status: AC
  Filled 2021-05-26: qty 20

## 2021-05-26 MED ORDER — PROMETHAZINE HCL 25 MG/ML IJ SOLN: 6.2500 mg | INTRAMUSCULAR | Status: DC | PRN

## 2021-05-26 MED ORDER — SUGAMMADEX SODIUM 200 MG/2ML IV SOLN
INTRAVENOUS | Status: DC | PRN
  Administered 2021-05-26: 200 mg via INTRAVENOUS

## 2021-05-26 MED ORDER — CEFAZOLIN SODIUM-DEXTROSE 2-4 GM/100ML-% IV SOLN
2.0000 g | INTRAVENOUS | Status: AC
  Administered 2021-05-26: 2 g via INTRAVENOUS
  Filled 2021-05-26: qty 100

## 2021-05-26 MED ORDER — DIPHENHYDRAMINE HCL 50 MG/ML IJ SOLN
INTRAMUSCULAR | Status: DC | PRN
  Administered 2021-05-26: 25 mg via INTRAVENOUS

## 2021-05-26 MED ORDER — METHOCARBAMOL 1000 MG/10ML IJ SOLN
500.0000 mg | Freq: Four times a day (QID) | INTRAVENOUS | Status: DC | PRN
  Filled 2021-05-26: qty 5

## 2021-05-26 MED ORDER — MUPIROCIN 2 % EX OINT: 1.0000 "application " | TOPICAL_OINTMENT | Freq: Two times a day (BID) | CUTANEOUS | Status: DC

## 2021-05-26 MED ORDER — FENTANYL CITRATE PF 50 MCG/ML IJ SOSY
100.0000 ug | PREFILLED_SYRINGE | Freq: Once | INTRAMUSCULAR | Status: AC
  Administered 2021-05-26: 100 ug via INTRAVENOUS
  Filled 2021-05-26: qty 2

## 2021-05-26 MED ORDER — ROCURONIUM BROMIDE 10 MG/ML (PF) SYRINGE
PREFILLED_SYRINGE | INTRAVENOUS | Status: DC | PRN
  Administered 2021-05-26: 100 mg via INTRAVENOUS

## 2021-05-26 MED ORDER — TRANEXAMIC ACID-NACL 1000-0.7 MG/100ML-% IV SOLN
1000.0000 mg | Freq: Once | INTRAVENOUS | Status: AC
  Administered 2021-05-26: 1000 mg via INTRAVENOUS
  Filled 2021-05-26: qty 100

## 2021-05-26 MED ORDER — ORAL CARE MOUTH RINSE: 15.0000 mL | Freq: Once | OROMUCOSAL | Status: AC

## 2021-05-26 SURGICAL SUPPLY — 62 items
BAG COUNTER SPONGE SURGICOUNT (BAG) ×2 IMPLANT
BIT DRILL CALIBRATED 4.2 (BIT) ×1 IMPLANT
BIT DRILL CANN QC 12.8 (BIT) ×2 IMPLANT
BIT DRILL SHORT 4.2 (BIT) ×1 IMPLANT
BNDG COHESIVE 4X5 TAN STRL (GAUZE/BANDAGES/DRESSINGS) ×2 IMPLANT
BNDG ELASTIC 4X5.8 VLCR STR LF (GAUZE/BANDAGES/DRESSINGS) ×2 IMPLANT
BNDG ELASTIC 6X10 VLCR STRL LF (GAUZE/BANDAGES/DRESSINGS) ×2 IMPLANT
BNDG ELASTIC 6X5.8 VLCR STR LF (GAUZE/BANDAGES/DRESSINGS) ×2 IMPLANT
BRUSH SCRUB EZ PLAIN DRY (MISCELLANEOUS) ×4 IMPLANT
CHLORAPREP W/TINT 26 (MISCELLANEOUS) ×2 IMPLANT
COVER MAYO STAND STRL (DRAPES) ×2 IMPLANT
COVER SURGICAL LIGHT HANDLE (MISCELLANEOUS) ×4 IMPLANT
DERMABOND ADVANCED (GAUZE/BANDAGES/DRESSINGS) ×1
DERMABOND ADVANCED .7 DNX12 (GAUZE/BANDAGES/DRESSINGS) ×1 IMPLANT
DRAPE C-ARM 42X72 X-RAY (DRAPES) ×2 IMPLANT
DRAPE C-ARMOR (DRAPES) ×2 IMPLANT
DRAPE HALF SHEET 40X57 (DRAPES) ×4 IMPLANT
DRAPE IMP U-DRAPE 54X76 (DRAPES) ×4 IMPLANT
DRAPE INCISE IOBAN 66X45 STRL (DRAPES) ×2 IMPLANT
DRAPE ORTHO SPLIT 77X108 STRL (DRAPES) ×2
DRAPE SURG 17X23 STRL (DRAPES) ×2 IMPLANT
DRAPE SURG ORHT 6 SPLT 77X108 (DRAPES) ×2 IMPLANT
DRAPE U-SHAPE 47X51 STRL (DRAPES) ×2 IMPLANT
DRESSING MEPILEX FLEX 4X4 (GAUZE/BANDAGES/DRESSINGS) ×1 IMPLANT
DRILL BIT CALIBRATED 4.2 (BIT) ×2
DRILL BIT SHORT 4.2 (BIT) ×1
DRSG MEPILEX FLEX 4X4 (GAUZE/BANDAGES/DRESSINGS) ×2
ELECT REM PT RETURN 9FT ADLT (ELECTROSURGICAL) ×2
ELECTRODE REM PT RTRN 9FT ADLT (ELECTROSURGICAL) ×1 IMPLANT
GAUZE SPONGE 4X4 12PLY STRL (GAUZE/BANDAGES/DRESSINGS) ×2 IMPLANT
GLOVE SURG ENC MOIS LTX SZ6.5 (GLOVE) ×6 IMPLANT
GLOVE SURG ENC MOIS LTX SZ7.5 (GLOVE) ×8 IMPLANT
GLOVE SURG UNDER POLY LF SZ6.5 (GLOVE) ×2 IMPLANT
GLOVE SURG UNDER POLY LF SZ7.5 (GLOVE) ×2 IMPLANT
GOWN STRL REUS W/ TWL LRG LVL3 (GOWN DISPOSABLE) ×2 IMPLANT
GOWN STRL REUS W/TWL LRG LVL3 (GOWN DISPOSABLE) ×2
GUIDEWIRE 3.2X400 (WIRE) ×2 IMPLANT
KIT BASIN OR (CUSTOM PROCEDURE TRAY) ×2 IMPLANT
KIT TURNOVER KIT B (KITS) ×2 IMPLANT
NAIL TIB RFNA BEND 11X380 5D (Nail) ×2 IMPLANT
PACK ORTHO EXTREMITY (CUSTOM PROCEDURE TRAY) ×2 IMPLANT
PAD ARMBOARD 7.5X6 YLW CONV (MISCELLANEOUS) ×4 IMPLANT
PAD CAST 4YDX4 CTTN HI CHSV (CAST SUPPLIES) ×1 IMPLANT
PADDING CAST COTTON 4X4 STRL (CAST SUPPLIES) ×1
REAMER ROD DEEP FLUTE 2.5X950 (INSTRUMENTS) ×2 IMPLANT
SCREW LOCK IM 5X36 (Screw) ×1 IMPLANT
SCREW LOCK IM 5X86 (Screw) ×2 IMPLANT
SCREW LOCK IM 5X90 (Screw) ×2 IMPLANT
SCREW LOCK IM NAIL 5.0X34 (Screw) ×2 IMPLANT
SCREW LOCK IM NAIL 5X66 (Screw) ×2 IMPLANT
SCREW LOCK X25 36X5X IM (Screw) ×1 IMPLANT
SPONGE T-LAP 18X18 ~~LOC~~+RFID (SPONGE) ×2 IMPLANT
STAPLER VISISTAT 35W (STAPLE) ×2 IMPLANT
SUT MNCRL AB 3-0 PS2 18 (SUTURE) ×2 IMPLANT
SUT VIC AB 0 CT1 27 (SUTURE)
SUT VIC AB 0 CT1 27XBRD ANBCTR (SUTURE) IMPLANT
SUT VIC AB 2-0 CT1 27 (SUTURE)
SUT VIC AB 2-0 CT1 TAPERPNT 27 (SUTURE) IMPLANT
TOWEL GREEN STERILE (TOWEL DISPOSABLE) ×4 IMPLANT
TOWEL GREEN STERILE FF (TOWEL DISPOSABLE) ×2 IMPLANT
TUBE CONNECTING 12X1/4 (SUCTIONS) ×2 IMPLANT
YANKAUER SUCT BULB TIP NO VENT (SUCTIONS) ×2 IMPLANT

## 2021-05-26 NOTE — Plan of Care (Signed)
  Problem: Education: Goal: Knowledge of General Education information will improve Description: Including pain rating scale, medication(s)/side effects and non-pharmacologic comfort measures Outcome: Progressing   Problem: Health Behavior/Discharge Planning: Goal: Ability to manage health-related needs will improve Outcome: Progressing   Problem: Clinical Measurements: Goal: Ability to maintain clinical measurements within normal limits will improve Outcome: Progressing   Problem: Nutrition: Goal: Adequate nutrition will be maintained Outcome: Progressing   Problem: Coping: Goal: Level of anxiety will decrease Outcome: Progressing   Problem: Pain Managment: Goal: General experience of comfort will improve Outcome: Progressing   Problem: Safety: Goal: Ability to remain free from injury will improve Outcome: Progressing   

## 2021-05-26 NOTE — Anesthesia Procedure Notes (Signed)
Procedure Name: Intubation Date/Time: 05/26/2021 3:36 PM Performed by: Zollie Scale, CRNA Pre-anesthesia Checklist: Patient identified, Emergency Drugs available, Suction available and Patient being monitored Patient Re-evaluated:Patient Re-evaluated prior to induction Oxygen Delivery Method: Circle System Utilized Preoxygenation: Pre-oxygenation with 100% oxygen Induction Type: IV induction Ventilation: Mask ventilation without difficulty and Oral airway inserted - appropriate to patient size Laryngoscope Size: Hyacinth Meeker and 3 Grade View: Grade I Tube type: Oral Tube size: 7.5 mm Number of attempts: 1 Airway Equipment and Method: Stylet and Oral airway Placement Confirmation: ETT inserted through vocal cords under direct vision, positive ETCO2 and breath sounds checked- equal and bilateral Secured at: 22 cm Tube secured with: Tape Dental Injury: Teeth and Oropharynx as per pre-operative assessment
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
## 2021-05-26 NOTE — Progress Notes (Signed)
   05/25/21 2355  Clinical Encounter Type  Visited With Patient not available  Visit Type Trauma  Referral From Nurse  Consult/Referral To Chaplain   Chaplain responded. The medical team is attending to the patient. There is no support person here at this time. Advise that the Chaplain remains available for follow-up spiritual and emotional support as needed. This note was prepared by Deneen Harts, M.Div..  For questions please contact by phone [phone].
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
## 2021-05-26 NOTE — ED Notes (Signed)
Pt's belongings/clothing placed in brown paper bags and sealed by Autumn, Trauma RN. Handed off to CSI.

## 2021-05-26 NOTE — TOC CAGE-AID Note (Signed)
Transition of Care [facility]) - CAGE-AID Screening   Patient Details  Name: Zacheriah Stumpe MRN: [id number] Date of Birth: 1996/07/30  [facility] CM/SW Contact:    Merline Perkin C Tarpley-Carter, LCSWA Phone Number: 05/26/2021, 12:59 PM   Clinical Narrative: Pt participated in Cage-Aid.  Pt admitted to marijuana use, but denied ETOH use.  Pt was offered resources, pt refused.  CSW will provide pt with resources for possible future use.    Oswin Griffith Tarpley-Carter, MSW, LCSW-A Pronouns:  She/Her/Hers [facility]itions of Care Clinical Social Worker Direct Number:  [phone] [email or web address]     CAGE-AID Screening: Substance Abuse Screening unable to be completed due to: :  (Pt admitted to marijuana use.)  Have You Ever Felt You Ought to Cut Down on Your Drinking or Drug Use?: No Have People Annoyed You By [employer] Your Drinking Or Drug Use?: No Have You Felt Bad Or Guilty About Your Drinking Or Drug Use?: No Have You Ever Had a Drink or Used Drugs First Thing In The Morning to Steady Your Nerves or to Get Rid of a Hangover?: No CAGE-AID Score: 0  Substance Abuse Education Offered: Yes  Substance abuse interventions: Transport planner

## 2021-05-26 NOTE — Anesthesia Postprocedure Evaluation (Signed)
Anesthesia Post Note  Patient: Blake Clark  Procedure(s) Performed: INTRAMEDULLARY (IM) RETROGRADE FEMORAL NAILING (Right)     Patient location during evaluation: PACU Anesthesia Type: General Level of consciousness: sedated Pain management: pain level controlled Vital Signs Assessment: post-procedure vital signs reviewed and stable Respiratory status: spontaneous breathing and respiratory function stable Cardiovascular status: stable Postop Assessment: no apparent nausea or vomiting Anesthetic complications: no   No notable events documented.  Last Vitals:  Vitals:   05/26/21 1455 05/26/21 1720  BP: (!) 158/90 118/77  Pulse: 88 76  Resp: 20 15  Temp: 36.8 C 36.8 C  SpO2: 100% 100%    Last Pain:  Vitals:   05/26/21 1720  TempSrc:   PainSc: Asleep                 Naren Benally [place]

## 2021-05-26 NOTE — ED Notes (Signed)
Ortho tech at bedside 
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
## 2021-05-26 NOTE — ED Notes (Signed)
Pt transported to CT ?
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
## 2021-05-27 ENCOUNTER — Other Ambulatory Visit (HOSPITAL_COMMUNITY): Payer: Self-pay

## 2021-05-27 LAB — CBC
HCT: 22.5 % — ABNORMAL LOW (ref 39.0–52.0)
HCT: 22.1 % — ABNORMAL LOW (ref 39.0–52.0)
Hemoglobin: 7.6 g/dL — ABNORMAL LOW (ref 13.0–17.0)
Hemoglobin: 7.6 g/dL — ABNORMAL LOW (ref 13.0–17.0)
MCH: 31 pg (ref 26.0–34.0)
MCH: 31.1 pg (ref 26.0–34.0)
MCHC: 33.8 g/dL (ref 30.0–36.0)
MCHC: 34.4 g/dL (ref 30.0–36.0)
MCV: 91.8 fL (ref 80.0–100.0)
MCV: 90.6 fL (ref 80.0–100.0)
Platelets: 157 10*3/uL (ref 150–400)
Platelets: 159 10*3/uL (ref 150–400)
RBC: 2.45 MIL/uL — ABNORMAL LOW (ref 4.22–5.81)
RBC: 2.44 MIL/uL — ABNORMAL LOW (ref 4.22–5.81)
RDW: 13.5 % (ref 11.5–15.5)
RDW: 13.5 % (ref 11.5–15.5)
WBC: 12.1 10*3/uL — ABNORMAL HIGH (ref 4.0–10.5)
WBC: 12 10*3/uL — ABNORMAL HIGH (ref 4.0–10.5)
nRBC: 0 % (ref 0.0–0.2)
nRBC: 0 % (ref 0.0–0.2)

## 2021-05-27 LAB — BASIC METABOLIC PANEL
Anion gap: 4 — ABNORMAL LOW (ref 5–15)
BUN: 12 mg/dL (ref 6–20)
CO2: 26 mmol/L (ref 22–32)
Calcium: 8 mg/dL — ABNORMAL LOW (ref 8.9–10.3)
Chloride: 105 mmol/L (ref 98–111)
Creatinine, Ser: 0.97 mg/dL (ref 0.61–1.24)
GFR, Estimated: >60 mL/min (ref >60)
Glucose, Bld: 121 mg/dL — ABNORMAL HIGH (ref 70–99)
Potassium: 4.2 mmol/L (ref 3.5–5.1)
Sodium: 135 mmol/L (ref 135–145)

## 2021-05-27 MED ORDER — METHOCARBAMOL 500 MG PO TABS: 500.0000 mg | ORAL_TABLET | Freq: Four times a day (QID) | ORAL | 0 refills | Status: DC | PRN

## 2021-05-27 MED ORDER — VITAMIN D (ERGOCALCIFEROL) 1.25 MG (50000 UNIT) PO CAPS
50000.0000 [IU] | ORAL_CAPSULE | ORAL | Status: DC
  Administered 2021-05-27: 50000 [IU] via ORAL
  Filled 2021-05-27: qty 1

## 2021-05-27 MED ORDER — VITAMIN D (ERGOCALCIFEROL) 1.25 MG (50000 UNIT) PO CAPS
50000.0000 [IU] | ORAL_CAPSULE | ORAL | 0 refills | Status: DC
  Filled 2021-05-27: qty 4, 28d supply, fill #0

## 2021-05-27 MED ORDER — OXYCODONE HCL 5 MG PO TABS
5.0000 mg | ORAL_TABLET | ORAL | 0 refills | Status: DC | PRN
  Filled 2021-05-27: qty 25, 2d supply, fill #0

## 2021-05-27 MED ORDER — DOCUSATE SODIUM 100 MG PO CAPS: 100.0000 mg | ORAL_CAPSULE | Freq: Two times a day (BID) | ORAL | 0 refills | Status: DC

## 2021-05-27 MED ORDER — METHOCARBAMOL 500 MG PO TABS
500.0000 mg | ORAL_TABLET | Freq: Four times a day (QID) | ORAL | 0 refills | Status: DC | PRN
  Filled 2021-05-27: qty 50, 13d supply, fill #0

## 2021-05-27 MED ORDER — DOCUSATE SODIUM 100 MG PO CAPS
100.0000 mg | ORAL_CAPSULE | Freq: Two times a day (BID) | ORAL | 0 refills | Status: AC
  Filled 2021-05-27: qty 28, 14d supply, fill #0

## 2021-05-27 MED ORDER — VITAMIN D (ERGOCALCIFEROL) 1.25 MG (50000 UNIT) PO CAPS: 50000.0000 [IU] | ORAL_CAPSULE | ORAL | 0 refills | Status: DC

## 2021-05-27 MED ORDER — APIXABAN 2.5 MG PO TABS
2.5000 mg | ORAL_TABLET | Freq: Two times a day (BID) | ORAL | 0 refills | Status: DC
  Filled 2021-05-27: qty 60, 30d supply, fill #0

## 2021-05-27 MED ORDER — OXYCODONE HCL 5 MG PO TABS: 5.0000 mg | ORAL_TABLET | ORAL | 0 refills | Status: DC | PRN

## 2021-05-27 MED ORDER — ACETAMINOPHEN 500 MG PO TABS: 1000.0000 mg | ORAL_TABLET | Freq: Four times a day (QID) | ORAL | 0 refills | Status: DC | PRN

## 2021-05-27 MED ORDER — FERROUS SULFATE 325 (65 FE) MG PO TABS
325.0000 mg | ORAL_TABLET | Freq: Every day | ORAL | 0 refills | Status: DC
  Filled 2021-05-27: qty 14, 14d supply, fill #0

## 2021-05-27 MED ORDER — APIXABAN 2.5 MG PO TABS: 2.5000 mg | ORAL_TABLET | Freq: Two times a day (BID) | ORAL | 0 refills | Status: DC

## 2021-05-27 MED ORDER — ACETAMINOPHEN 500 MG PO TABS: 1000.0000 mg | ORAL_TABLET | Freq: Four times a day (QID) | ORAL | 0 refills | Status: AC | PRN

## 2021-05-27 MED ORDER — FERROUS SULFATE 325 (65 FE) MG PO TABS: 325.0000 mg | ORAL_TABLET | Freq: Every day | ORAL | 0 refills | Status: DC
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
## 2021-05-27 NOTE — Plan of Care (Signed)

## 2021-05-27 NOTE — Progress Notes (Signed)
Pt taught discharge information, Med's returned from pharmacy, IV taken out and all belongings collected
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
## 2021-05-27 NOTE — Progress Notes (Signed)
Orthopedic Tech Progress Note Patient Details:  Blake Clark January 01, 1996 [id number] Left crutches at bedside.  Ortho Devices Type of Ortho Device: Crutches Ortho Device/Splint Interventions: Ordered, Adjustment   Post Interventions Patient Tolerated: Well Instructions Provided: Adjustment of device  Kerry Fort 05/27/2021, 10:33 AM

## 2021-05-27 NOTE — Progress Notes (Signed)
Mobility Specialist Progress Note    05/27/21 1538  Mobility  Activity Ambulated in room  Level of Assistance Modified independent, requires aide device or extra time  Assistive Device Crutches  Distance Ambulated (ft) 120 ft (80+40)  Mobility Ambulated with assistance in room  Mobility Response Tolerated well  Mobility performed by Mobility specialist  $Mobility charge 1 Mobility   Pt received in bed and agreeable. Only complained of itchy foot but no pain. Had some LOB when turning around. Returned to EOB.   [place] Mobility Specialist  Mobility Specialist Phone: [phone]
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
## 2021-05-27 NOTE — Progress Notes (Signed)
Patient refused labs this morning and endorsed to do it later. Significant other in the room laying on the bed with the pt.
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
## 2021-05-31 ENCOUNTER — Encounter (HOSPITAL_COMMUNITY): Payer: Self-pay | Admitting: Student

## 2021-06-08 ENCOUNTER — Other Ambulatory Visit (HOSPITAL_COMMUNITY): Payer: Self-pay

## 2021-06-08 ENCOUNTER — Telehealth (HOSPITAL_COMMUNITY): Payer: Self-pay | Admitting: Pharmacist

## 2021-06-08 NOTE — Telephone Encounter (Signed)
Pharmacy Transitions of Care Follow-up Telephone Call  Date of discharge: 05/27/21  Discharge Diagnosis: DVT prophylaxis s/p IM nail of right femur  How have you been since you were released from the hospital?  Doing fine  Medication changes made at discharge: START taking: acetaminophen (TYLENOL)  docusate sodium (COLACE)  Eliquis (apixaban)  FeroSul (ferrous sulfate)  methocarbamol (ROBAXIN)  oxyCODONE (Oxy IR/ROXICODONE)  Vitamin D (Ergocalciferol) (DRISDOL)     Medication changes verified by the patient? Y    Medication Accessibility:  Home Pharmacy: [facility] at [facility]   Was the patient provided with refills on discharged medications? Y (Vit D)   Have all prescriptions been transferred from [facility] to home pharmacy?  Y  Is the patient able to afford medications?  For DVT prophylaxis ( was discharged on a 30 day supply)    Medication Review:   APIXABAN (ELIQUIS)  -Apixaban 2.5mg  BID started on 05/27/21  for 30 days per pt. He will check with Ortho follow-up if duration of therapy needs to be prolonged and he will ask for a new prescription -Patient declined medication review (per pt, was educated by a hospital pharmacist prior to discharge  Follow-up Appointments:.   [facility] f/u appt confirmed?  Scheduled to see Dr Robby Sermon (Ortho) on 06/14/21  @ 11am per pt.   If their condition worsens, is the pt aware to call PCP or go to the Emergency Dept.? Y  Final Patient Assessment:  -Pt is doing fine -Pt declined medication review.  -Pt has post discharge appointment with Ortho per pt and refill of Vit D sent to [facility] -Pt reports he needed more pain med and instructed him to call Ortho office.  Dorethea Clan, PharmD

## 2022-03-04 ENCOUNTER — Emergency Department (HOSPITAL_COMMUNITY)
Admission: EM | Admit: 2022-03-04 | Discharge: 2022-03-04 | Discharge status: Against Medical Advice | Payer: Self-pay | Attending: Emergency Medicine | Admitting: Emergency Medicine

## 2022-03-04 ENCOUNTER — Other Ambulatory Visit: Payer: Self-pay

## 2022-03-04 ENCOUNTER — Emergency Department: Payer: Medicaid Other

## 2022-03-04 ENCOUNTER — Encounter: Payer: Self-pay | Admitting: Emergency Medicine

## 2022-03-04 ENCOUNTER — Ambulatory Visit
Admission: EM | Admit: 2022-03-04 | Discharge: 2022-03-04 | Disposition: A | Discharge status: Home / Self Care | Payer: Medicaid Other | Attending: Urgent Care | Admitting: Urgent Care

## 2022-03-04 ENCOUNTER — Encounter (HOSPITAL_COMMUNITY): Payer: Self-pay

## 2022-03-04 DIAGNOSIS — R369 Urethral discharge, unspecified: Secondary | ICD-10-CM

## 2022-03-04 DIAGNOSIS — Z5321 Procedure and treatment not carried out due to patient leaving prior to being seen by health care provider: Secondary | ICD-10-CM | POA: Insufficient documentation

## 2022-03-04 DIAGNOSIS — Z7251 High risk heterosexual behavior: Secondary | ICD-10-CM | POA: Insufficient documentation

## 2022-03-04 MED ORDER — DOXYCYCLINE HYCLATE 100 MG PO CAPS: 100.0000 mg | ORAL_CAPSULE | Freq: Two times a day (BID) | ORAL | 0 refills | Status: DC

## 2022-03-04 MED ORDER — CEFTRIAXONE SODIUM 500 MG IJ SOLR
500.0000 mg | Freq: Once | INTRAMUSCULAR | Status: AC
  Administered 2022-03-04: 500 mg via INTRAMUSCULAR

## 2022-03-04 NOTE — ED Triage Notes (Signed)
Pt here with penile discharge x 3 days.

## 2022-03-04 NOTE — Discharge Instructions (Addendum)
Avoid all forms of sexual intercourse (oral, vaginal, anal) for the next 7 days to avoid spreading/reinfecting or at least until we can see what kinds of infection results are positive.  Abstaining for 2 weeks would be better but at least 1 week is required.  We will let you know about your test results from the swab we did today and if you need any prescriptions for antibiotics or changes to your treatment from today.  

## 2022-03-04 NOTE — ED Provider Notes (Signed)
[facility]   MRN: [id number] DOB: Feb 22, 1996  Subjective:   Blake Clark is a 26 y.o. male presenting for 3-day history of acute onset penile discharge. Patient is sexually active and is having unprotected sex. Denies dysuria, hematuria, urinary frequency, penile swelling, testicular pain, testicular swelling, anal pain, groin pain.   No current facility-administered medications for this encounter.  Current Outpatient Medications:    acetaminophen (TYLENOL) 500 MG tablet, Take 2 tablets (1,000 mg total) by mouth every 6 (six) hours as needed., Disp: 30 tablet, Rfl: 0   apixaban (ELIQUIS) 2.5 MG TABS tablet, Take 1 tablet (2.5 mg total) by mouth 2 (two) times daily., Disp: 60 tablet, Rfl: 0   ferrous sulfate 325 (65 FE) MG tablet, Take 1 tablet (325 mg total) by mouth daily with breakfast for 14 days., Disp: 14 tablet, Rfl: 0   methocarbamol (ROBAXIN) 500 MG tablet, Take 1 tablet (500 mg total) by mouth every 6 (six) hours as needed for muscle spasms., Disp: 50 tablet, Rfl: 0   oxyCODONE (OXY IR/ROXICODONE) 5 MG immediate release tablet, Take 1-2 tablets (5-10 mg total) by mouth every 4 (four) hours as needed for moderate pain., Disp: 25 tablet, Rfl: 0   Vitamin D, Ergocalciferol, (DRISDOL) 1.25 MG (50000 UNIT) CAPS capsule, Take 1 capsule (50,000 Units total) by mouth every 7 (seven) days., Disp: 8 capsule, Rfl: 0   No Known Allergies  Past Medical History:  Diagnosis Date   Attention deficit disorder (ADD)      Past Surgical History:  Procedure Laterality Date   FEMUR IM NAIL Right 05/26/2021   Procedure: INTRAMEDULLARY (IM) RETROGRADE FEMORAL NAILING;  Surgeon: Roby Lofts, MD;  Location: [facility] OR;  Service: Orthopedics;  Laterality: Right;   TONSILLECTOMY     TONSILLECTOMY AND ADENOIDECTOMY      History reviewed. No pertinent family history.  Social History   Tobacco Use   Smoking status: Never   Smokeless tobacco: Never  Vaping Use   Vaping  Use: Never used  Substance Use Topics   Alcohol use: Yes   Drug use: Yes    Types: Marijuana    Comment: DAILY    ROS   Objective:   Vitals: BP 130/80   Pulse (!) 57   Temp 98.6 F (37 C)   Resp 20   SpO2 98%   Physical Exam Constitutional:      General: He is not in acute distress.    Appearance: Normal appearance. He is well-developed and normal weight. He is not ill-appearing, toxic-appearing or diaphoretic.  HENT:     Head: Normocephalic and atraumatic.     Right Ear: External ear normal.     Left Ear: External ear normal.     Nose: Nose normal.     Mouth/Throat:     Pharynx: Oropharynx is clear.  Eyes:     General: No scleral icterus.       Right eye: No discharge.        Left eye: No discharge.     Extraocular Movements: Extraocular movements intact.  Cardiovascular:     Rate and Rhythm: Normal rate.  Pulmonary:     Effort: Pulmonary effort is normal.  Genitourinary:    Penis: Circumcised. Discharge (diffuse) present. No phimosis, paraphimosis, hypospadias, erythema, tenderness, swelling or lesions.   Musculoskeletal:     Cervical back: Normal range of motion.  Neurological:     Mental Status: He is alert and oriented to person, place, and time.  Psychiatric:        Mood and Affect: Mood normal.        Behavior: Behavior normal.        Thought Content: Thought content normal.        Judgment: Judgment normal.    IM ceftriaxone in clinic.  Assessment and Plan :   PDMP not reviewed this encounter.  1. Penile discharge   2. Unprotected sex    Patient treated empirically as per CDC guidelines with IM ceftriaxone, doxycycline as an outpatient.  Labs pending.   Counseled on safe sex practices including abstaining for 1 week following treatment.  Counseled patient on potential for adverse effects with medications prescribed/recommended today, ER and return-to-clinic precautions discussed, patient verbalized understanding.    Wallis Bamberg, [place] 03/04/22  1727

## 2022-03-04 NOTE — ED Triage Notes (Signed)
Pt reports abnormal penile discharge that began yesterday. Pt reports hx of gonorrhea and states these symptoms seem the same.

## 2022-03-04 NOTE — ED Provider Triage Note (Signed)
Emergency Medicine Provider Triage Evaluation Note  Blake Clark , a 26 y.o. male  was evaluated in triage.  Pt complains of penile discharge since yesterday.  Denies dysuria.  Reports recent unprotected sex.  Reports history of STI and this is similar.  Review of Systems  Positive: As above Negative: As above  Physical Exam  BP (!) 141/82 (BP Location: Right Arm)   Pulse 61   Temp 98.2 F (36.8 C) (Oral)   Resp 16   SpO2 96%  Gen:   Awake, no distress   Resp:  Normal effort  MSK:   Moves extremities without difficulty Other:    Medical Decision Making  Medically screening exam initiated at 3:21 PM.  Appropriate orders placed.  Khalil Szczepanik was informed that the remainder of the evaluation will be completed by another provider, this initial triage assessment does not replace that evaluation, and the importance of remaining in the ED until their evaluation is complete.     Marita [place], PA-C 03/04/22 [phone]

## 2022-03-07 LAB — CYTOLOGY, (ORAL, ANAL, URETHRAL) ANCILLARY ONLY
Chlamydia: NEGATIVE
Comment: NEGATIVE
Comment: NEGATIVE
Comment: NORMAL
Neisseria Gonorrhea: POSITIVE — AB
Trichomonas: NEGATIVE
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
## 2023-10-04 ENCOUNTER — Other Ambulatory Visit: Payer: Self-pay

## 2023-10-04 ENCOUNTER — Emergency Department (HOSPITAL_COMMUNITY)

## 2023-10-04 ENCOUNTER — Emergency Department (HOSPITAL_COMMUNITY): Admission: EM | Admit: 2023-10-04 | Discharge: 2023-10-04 | Disposition: A | Discharge status: Home / Self Care

## 2023-10-04 ENCOUNTER — Encounter (HOSPITAL_COMMUNITY): Payer: Self-pay

## 2023-10-04 DIAGNOSIS — L03115 Cellulitis of right lower limb: Secondary | ICD-10-CM | POA: Diagnosis not present

## 2023-10-04 DIAGNOSIS — D72829 Elevated white blood cell count, unspecified: Secondary | ICD-10-CM | POA: Insufficient documentation

## 2023-10-04 DIAGNOSIS — L0390 Cellulitis, unspecified: Secondary | ICD-10-CM | POA: Diagnosis not present

## 2023-10-04 DIAGNOSIS — M25571 Pain in right ankle and joints of right foot: Secondary | ICD-10-CM | POA: Diagnosis present

## 2023-10-04 LAB — BASIC METABOLIC PANEL
Anion gap: 11 (ref 5–15)
BUN: 13 mg/dL (ref 6–20)
CO2: 27 mmol/L (ref 22–32)
Calcium: 9.4 mg/dL (ref 8.9–10.3)
Chloride: 102 mmol/L (ref 98–111)
Creatinine, Ser: 1.1 mg/dL (ref 0.61–1.24)
GFR, Estimated: >60 mL/min (ref >60)
Glucose, Bld: 101 mg/dL — ABNORMAL HIGH (ref 70–99)
Potassium: 3.9 mmol/L (ref 3.5–5.1)
Sodium: 140 mmol/L (ref 135–145)

## 2023-10-04 LAB — CBC WITH DIFFERENTIAL/PLATELET
Abs Immature Granulocytes: 0.05 10*3/uL (ref 0.00–0.07)
Basophils Absolute: 0 10*3/uL (ref 0.0–0.1)
Basophils Relative: 0 %
Eosinophils Absolute: 0 10*3/uL (ref 0.0–0.5)
Eosinophils Relative: 0 %
HCT: 43.4 % (ref 39.0–52.0)
Hemoglobin: 14.9 g/dL (ref 13.0–17.0)
Immature Granulocytes: 0 %
Lymphocytes Relative: 16 %
Lymphs Abs: 2.7 10*3/uL (ref 0.7–4.0)
MCH: 30.8 pg (ref 26.0–34.0)
MCHC: 34.3 g/dL (ref 30.0–36.0)
MCV: 89.7 fL (ref 80.0–100.0)
Monocytes Absolute: 1.4 10*3/uL — ABNORMAL HIGH (ref 0.1–1.0)
Monocytes Relative: 9 %
Neutro Abs: 12.4 10*3/uL — ABNORMAL HIGH (ref 1.7–7.7)
Neutrophils Relative %: 75 %
Platelets: 249 10*3/uL (ref 150–400)
RBC: 4.84 MIL/uL (ref 4.22–5.81)
RDW: 13.8 % (ref 11.5–15.5)
WBC: 16.6 10*3/uL — ABNORMAL HIGH (ref 4.0–10.5)
nRBC: 0 % (ref 0.0–0.2)

## 2023-10-04 MED ORDER — CEPHALEXIN 500 MG PO CAPS: 500.0000 mg | ORAL_CAPSULE | Freq: Four times a day (QID) | ORAL | 0 refills | Status: AC

## 2023-10-04 NOTE — ED Notes (Signed)
 Pt ambulatory to bathroom

## 2023-10-04 NOTE — Discharge Instructions (Addendum)
 You are seen today for cellulitis of the right foot.  Your x-rays were negative for any bony abnormality at this point however your labs did show an elevated white count which is most likely due to to the cellulitis in your right extremity.  Low suspicion for any emergent pathology present at this time however we will send you home with Keflex.  Take this with food to prevent stomach upset.  This is an antibiotic which will help treat the infection.  However if redness is continuing to spread despite treatment and pain increases despite treatment, return to ED for further workup.

## 2023-10-04 NOTE — ED Provider Notes (Signed)
 [facility] AT [facility] Provider Note   CSN: [id number] Arrival date & time: 10/04/23  4098     History  Chief Complaint  Patient presents with   Foot Injury    Blake Clark is a 28 y.o. male.   Foot Injury  Patient is a 28 year old male presents to the ED today complaining of a bulla present on the right foot that has grown in size after starting out as a vesicle when playing basketball with new shoes that caused it.  States that has begun becoming increasingly more painful and has also been accompanied with ankle swelling at this time.  Reports subjective fever on Monday without having taken temp.  Is able to ambulate well "walking on tiptoes".  Denies numbness, weakness, tingling, headache, visual changes, chest pain, shortness breath, abdominal pain, nausea, vomiting, diarrhea.    Home Medications Prior to Admission medications   Medication Sig Start Date End Date Taking? Authorizing Provider  acetaminophen (TYLENOL) 500 MG tablet Take 2 tablets (1,000 mg total) by mouth every 6 (six) hours as needed. 05/27/21  Yes Barnetta Chapel, PA-C  cephALEXin (KEFLEX) 500 MG capsule Take 1 capsule (500 mg total) by mouth 4 (four) times daily for 7 days. 10/04/23 10/11/23 Yes Lunette Stands, PA-C  ibuprofen (ADVIL) 200 MG tablet Take 800 mg by mouth every 6 (six) hours as needed for fever, mild pain (pain score 1-3) or moderate pain (pain score 4-6).   Yes [provider]      Allergies    Patient has no known allergies.    Review of Systems   Review of Systems  Musculoskeletal:  Positive for arthralgias, joint swelling and myalgias.  All other systems reviewed and are negative.   Physical Exam Updated Vital Signs BP 136/87   Pulse 84   Temp 98.7 F (37.1 C) (Temporal)   Resp 18   Ht 6\' 2"  (1.88 m)   Wt 102.1 kg   SpO2 98%   BMI 28.89 kg/m  Physical Exam Vitals and nursing note reviewed.  Constitutional:      General: He is not  in acute distress.    Appearance: Normal appearance. He is not ill-appearing or toxic-appearing.  HENT:     Head: Normocephalic and atraumatic.  Eyes:     General: No scleral icterus.       Right eye: No discharge.        Left eye: No discharge.     Extraocular Movements: Extraocular movements intact.     Conjunctiva/sclera: Conjunctivae normal.  Cardiovascular:     Rate and Rhythm: Normal rate and regular rhythm.     Pulses: Normal pulses.     Heart sounds: Normal heart sounds. No murmur heard.    No friction rub. No gallop.     Comments: DP 2+ bilaterally Pulmonary:     Effort: Pulmonary effort is normal. No respiratory distress.     Breath sounds: Normal breath sounds. No stridor. No wheezing, rhonchi or rales.  Abdominal:     General: Abdomen is flat.     Palpations: Abdomen is soft.     Tenderness: There is no abdominal tenderness.  Musculoskeletal:        General: Swelling (Mild edema noted to right ankle when compared to left) and tenderness (Right ankle tenderness noted to both lateral and medial malleolus.) present. No deformity.     Right lower leg: No edema.     Left lower leg: No edema.  Skin:  General: Skin is warm and dry.     Findings: Erythema (Erythema present around right ankle and bulla.) and lesion (4 x 4 cm bulla noted to the posterior aspect of right ankle.) present.  Neurological:     General: No focal deficit present.     Mental Status: He is alert and oriented to person, place, and time. Mental status is at baseline.     Sensory: No sensory deficit.     Motor: No weakness.  Psychiatric:        Mood and Affect: Mood normal.     ED Results / Procedures / Treatments   Labs (all labs ordered are listed, but only abnormal results are displayed) Labs Reviewed  CBC WITH DIFFERENTIAL/PLATELET - Abnormal; Notable for the following components:      Result Value   WBC 16.6 (*)    Neutro Abs 12.4 (*)    Monocytes Absolute 1.4 (*)    All other components  within normal limits  BASIC METABOLIC PANEL - Abnormal; Notable for the following components:   Glucose, Bld 101 (*)    All other components within normal limits    EKG None  Radiology DG Ankle 2 Views Right Result Date: 10/04/2023 CLINICAL DATA:  Playing basketball 7 days ago and got a sore on back of heel. Now large blood-filled blister with cellulitis. EXAM: RIGHT ANKLE - 2 VIEW COMPARISON:  None Available. FINDINGS: The ankle mortise is symmetric and intact. Normal bone mineralization. Joint spaces are preserved. No acute fracture or dislocation. No soft tissue defect is seen to indicate an ulcer. There is increased density within the soft tissue posterior to the calcaneus likely corresponding to the reported blister. No cortical erosion is seen. No subcutaneous air. IMPRESSION: Increased density within the soft tissue posterior to the calcaneus likely corresponding to the reported blister. No cortical erosion is seen. No subcutaneous air. Electronically Signed   By: Neita Garnet M.D.   On: 10/04/2023 11:47    Procedures Procedures    Medications Ordered in ED Medications - No data to display  ED Course/ Medical Decision Making/ A&P                                 Medical Decision Making Amount and/or Complexity of Data Reviewed Labs: ordered. Radiology: ordered.   Patient is a 28 year old male presents to the ED today complaining of a bulla present on the right foot that has grown in size after starting out as a vesicle when playing basketball with new shoes that caused it.  States that has begun becoming increasingly more painful and has also been accompanied with ankle swelling at this time.  Reports subjective fever on Monday without having taken temp.  Is able to ambulate.  On physical exam, patient is known to have a 4 x 4 centimeter bulla noted to the posterior aspect of his right foot.  Mild edema is noted to the medial and lateral malleolus of right ankle.  Mild  tenderness to palpation to the surrounding erythema around the bulla.  Patient is otherwise afebrile, no acute distress, normal gait.  White count was noted to be elevated at 16.6 and x-ray did not review any bony abnormality but did appreciate soft tissue swelling noted.  Low suspicion for osteomyelitis, septic arthritis at this time.  Symptoms most consistent with cellulitis at this time with bulla not appearing to be abscess at this time.  Do  not believe that it is drainable at this time.  Considered further MRI of foot but do not believe necessary at this time.  Will send home with Keflex and try to manage this outpatient.  Provided strict return to ER precautions.  Patient expressed agreement understanding of plan.  I believe this patient is safe to discharge at this time.  Differential diagnoses prior to evaluation: The emergent differential diagnosis includes, but is not limited to, septic arthritis, fracture, ligamentous injury, cellulitis, dermatitis. This is not an exhaustive differential.   Past Medical History / Co-morbidities / Social History: ADHD.  Additional history: Chart reviewed. Pertinent results include: Last visit was in 8//2023 for penile discharge  Lab Tests/Imaging studies: I personally interpreted labs/imaging and the pertinent results include:   CBC was notable for elevated white count of 16.6 BMP was unremarkable X-ray of right ankle noticed increased density with soft tissue posterior to the calcaneus with corresponding blister.  No cortical erosion no subcu air I agree with the radiologist interpretation.    Medications: I ordered medication including outpatient Keflex.  I have reviewed the patients home medicines and have made adjustments as needed.   Disposition: After consideration of the diagnostic results and the patients response to treatment, I feel that the patient benefit from discharge and treatment as above.   emergency department workup does not  suggest an emergent condition requiring admission or immediate intervention beyond what has been performed at this time. The plan is: Keflex for cellulitis, monitor symptoms, return for any new or worsening symptoms. The patient is safe for discharge and has been instructed to return immediately for worsening symptoms, change in symptoms or any other concerns.  Final Clinical Impression(s) / ED Diagnoses Final diagnoses:  Cellulitis of right lower extremity    Rx / DC Orders ED Discharge Orders          Ordered    cephALEXin (KEFLEX) 500 MG capsule  4 times daily        10/04/23 1201              Lunette Stands, [place] 10/04/23 1212    Coral Spikes, DO 10/04/23 1514

## 2023-10-04 NOTE — ED Triage Notes (Signed)
 Patient played basketball in high top shoes last Wednesday and got a sore on the back of his heel.  Reports it has got worse and now has large blood filled blister with cellulitis around it.

## 2024-01-04 ENCOUNTER — Ambulatory Visit
Admission: EM | Admit: 2024-01-04 | Discharge: 2024-01-04 | Disposition: A | Discharge status: Home / Self Care | Attending: Family Medicine | Admitting: Family Medicine

## 2024-01-04 DIAGNOSIS — J029 Acute pharyngitis, unspecified: Secondary | ICD-10-CM

## 2024-01-04 DIAGNOSIS — B349 Viral infection, unspecified: Secondary | ICD-10-CM | POA: Diagnosis not present

## 2024-01-04 LAB — POCT RAPID STREP A (OFFICE): Rapid Strep A Screen: NEGATIVE

## 2024-01-04 MED ORDER — IBUPROFEN 600 MG PO TABS
600.0000 mg | ORAL_TABLET | Freq: Four times a day (QID) | ORAL | 0 refills | Status: AC | PRN
Start: 2024-01-04 — End: ?

## 2024-01-04 MED ORDER — ACETAMINOPHEN 325 MG PO TABS
650.0000 mg | ORAL_TABLET | Freq: Once | ORAL | Status: AC
Start: 2024-01-04 — End: 2024-01-04
  Administered 2024-01-04: 650 mg via ORAL
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
## 2024-01-04 NOTE — Discharge Instructions (Signed)
 Please treat your symptoms with over the counter cough medication, tylenol ,  humidifier, and rest. Viral illnesses can last 7-14 days. Please follow up with your PCP if your symptoms are not improving. Please go to the ER for any worsening symptoms. This includes but is not limited to fever you can not control with tylenol  or ibuprofen , you are not able to stay hydrated, you have shortness of breath or chest pain.  Thank you for choosing [facility] for your healthcare needs. I hope you feel better soon!

## 2024-01-04 NOTE — ED Triage Notes (Signed)
 Pt presents to UC for c/o decreased appetite, nausea, chills, back aches, sore throat, fatigue since yesterday morning.

## 2024-01-08 ENCOUNTER — Ambulatory Visit (HOSPITAL_COMMUNITY): Payer: Self-pay

## 2024-01-08 LAB — CULTURE, GROUP A STREP (THRC)
